# Patient Record
Sex: Female | Born: 1975 | Race: Black or African American | Hispanic: No | Marital: Single | Smoking: Current some day smoker
Health system: Southern US, Community
[De-identification: ages and names within clinical notes are randomized; demographics above are authoritative.]

## PROBLEM LIST (undated history)

## (undated) DIAGNOSIS — Z8679 Personal history of other diseases of the circulatory system: Secondary | ICD-10-CM

## (undated) DIAGNOSIS — I10 Essential (primary) hypertension: Secondary | ICD-10-CM

## (undated) DIAGNOSIS — N939 Abnormal uterine and vaginal bleeding, unspecified: Secondary | ICD-10-CM

## (undated) HISTORY — PX: KNEE SURGERY: SHX244

## (undated) HISTORY — PX: BREAST ENHANCEMENT SURGERY: SHX7

---

## 1999-06-13 ENCOUNTER — Emergency Department (HOSPITAL_COMMUNITY): Admission: EM | Admit: 1999-06-13 | Discharge: 1999-06-13 | Payer: Self-pay | Admitting: Emergency Medicine

## 2002-02-08 ENCOUNTER — Encounter: Admission: RE | Admit: 2002-02-08 | Discharge: 2002-02-08 | Payer: Self-pay | Admitting: Obstetrics and Gynecology

## 2002-10-13 ENCOUNTER — Emergency Department (HOSPITAL_COMMUNITY): Admission: EM | Admit: 2002-10-13 | Discharge: 2002-10-13 | Payer: Self-pay | Admitting: Emergency Medicine

## 2003-02-19 ENCOUNTER — Inpatient Hospital Stay (HOSPITAL_COMMUNITY): Admission: AD | Admit: 2003-02-19 | Discharge: 2003-02-19 | Payer: Self-pay | Admitting: *Deleted

## 2003-08-31 HISTORY — PX: OTHER SURGICAL HISTORY: SHX169

## 2005-04-07 ENCOUNTER — Inpatient Hospital Stay (HOSPITAL_COMMUNITY): Admission: AD | Admit: 2005-04-07 | Discharge: 2005-04-07 | Payer: Self-pay | Admitting: Obstetrics and Gynecology

## 2005-12-13 ENCOUNTER — Emergency Department (HOSPITAL_COMMUNITY): Admission: EM | Admit: 2005-12-13 | Discharge: 2005-12-13 | Payer: Self-pay | Admitting: Emergency Medicine

## 2008-01-27 ENCOUNTER — Emergency Department (HOSPITAL_COMMUNITY): Admission: EM | Admit: 2008-01-27 | Discharge: 2008-01-27 | Payer: Self-pay | Admitting: Emergency Medicine

## 2008-07-24 ENCOUNTER — Emergency Department (HOSPITAL_COMMUNITY): Admission: EM | Admit: 2008-07-24 | Discharge: 2008-07-24 | Payer: Self-pay | Admitting: Family Medicine

## 2010-01-27 ENCOUNTER — Emergency Department (HOSPITAL_COMMUNITY): Admission: EM | Admit: 2010-01-27 | Discharge: 2010-01-27 | Payer: Self-pay | Admitting: Family Medicine

## 2010-07-04 ENCOUNTER — Emergency Department (HOSPITAL_COMMUNITY)
Admission: EM | Admit: 2010-07-04 | Discharge: 2010-07-04 | Payer: Self-pay | Source: Home / Self Care | Admitting: Emergency Medicine

## 2010-12-17 ENCOUNTER — Emergency Department (HOSPITAL_COMMUNITY)
Admission: EM | Admit: 2010-12-17 | Discharge: 2010-12-17 | Disposition: A | Discharge status: Home / Self Care | Payer: Self-pay | Attending: Emergency Medicine | Admitting: Emergency Medicine

## 2010-12-17 ENCOUNTER — Emergency Department (HOSPITAL_COMMUNITY): Payer: Self-pay

## 2010-12-17 ENCOUNTER — Inpatient Hospital Stay (INDEPENDENT_AMBULATORY_CARE_PROVIDER_SITE_OTHER)
Admission: RE | Admit: 2010-12-17 | Discharge: 2010-12-17 | Disposition: A | Discharge status: Home / Self Care | Payer: Self-pay | Source: Ambulatory Visit | Attending: Family Medicine | Admitting: Family Medicine

## 2010-12-17 DIAGNOSIS — M6281 Muscle weakness (generalized): Secondary | ICD-10-CM

## 2010-12-17 DIAGNOSIS — D72829 Elevated white blood cell count, unspecified: Secondary | ICD-10-CM | POA: Insufficient documentation

## 2010-12-17 DIAGNOSIS — R509 Fever, unspecified: Secondary | ICD-10-CM | POA: Insufficient documentation

## 2010-12-17 DIAGNOSIS — N390 Urinary tract infection, site not specified: Secondary | ICD-10-CM | POA: Insufficient documentation

## 2010-12-17 DIAGNOSIS — D7289 Other specified disorders of white blood cells: Secondary | ICD-10-CM

## 2010-12-17 LAB — DIFFERENTIAL
Basophils Absolute: 0 10*3/uL (ref 0.0–0.1)
Eosinophils Absolute: 0 10*3/uL (ref 0.0–0.7)
Lymphocytes Relative: 6 % — ABNORMAL LOW (ref 12–46)
Lymphs Abs: 1.6 10*3/uL (ref 0.7–4.0)
Neutro Abs: 25 10*3/uL — ABNORMAL HIGH (ref 1.7–7.7)

## 2010-12-17 LAB — CBC
MCH: 27.7 pg (ref 26.0–34.0)
MCHC: 33.4 g/dL (ref 30.0–36.0)
Platelets: 283 10*3/uL (ref 150–400)

## 2010-12-17 LAB — POCT URINALYSIS DIP (DEVICE)
Bilirubin Urine: NEGATIVE
Nitrite: NEGATIVE
Protein, ur: NEGATIVE mg/dL
pH: 6.5 (ref 5.0–8.0)

## 2010-12-17 LAB — URINALYSIS, ROUTINE W REFLEX MICROSCOPIC
Ketones, ur: NEGATIVE mg/dL
Nitrite: NEGATIVE
Protein, ur: NEGATIVE mg/dL
Urobilinogen, UA: 0.2 mg/dL (ref 0.0–1.0)

## 2010-12-17 LAB — POCT I-STAT, CHEM 8
Creatinine, Ser: 0.9 mg/dL (ref 0.4–1.2)
HCT: 38 % (ref 36.0–46.0)
Hemoglobin: 12.9 g/dL (ref 12.0–15.0)
Sodium: 136 mEq/L (ref 135–145)
TCO2: 24 mmol/L (ref 0–100)

## 2010-12-17 LAB — POCT PREGNANCY, URINE: Preg Test, Ur: NEGATIVE

## 2010-12-18 LAB — URINE CULTURE
Colony Count: NO GROWTH
Culture  Setup Time: 201204191600
Culture: NO GROWTH

## 2010-12-20 ENCOUNTER — Emergency Department (HOSPITAL_COMMUNITY)
Admission: EM | Admit: 2010-12-20 | Discharge: 2010-12-21 | Disposition: A | Discharge status: Home / Self Care | Payer: Self-pay | Attending: Emergency Medicine | Admitting: Emergency Medicine

## 2010-12-20 ENCOUNTER — Inpatient Hospital Stay (INDEPENDENT_AMBULATORY_CARE_PROVIDER_SITE_OTHER)
Admission: RE | Admit: 2010-12-20 | Discharge: 2010-12-20 | Disposition: A | Discharge status: Home / Self Care | Payer: Self-pay | Source: Ambulatory Visit | Attending: Emergency Medicine | Admitting: Emergency Medicine

## 2010-12-20 DIAGNOSIS — D649 Anemia, unspecified: Secondary | ICD-10-CM | POA: Insufficient documentation

## 2010-12-20 DIAGNOSIS — R51 Headache: Secondary | ICD-10-CM | POA: Insufficient documentation

## 2010-12-20 DIAGNOSIS — R6883 Chills (without fever): Secondary | ICD-10-CM | POA: Insufficient documentation

## 2010-12-20 DIAGNOSIS — R5381 Other malaise: Secondary | ICD-10-CM | POA: Insufficient documentation

## 2010-12-20 DIAGNOSIS — R5383 Other fatigue: Secondary | ICD-10-CM | POA: Insufficient documentation

## 2010-12-20 DIAGNOSIS — R0789 Other chest pain: Secondary | ICD-10-CM | POA: Insufficient documentation

## 2010-12-20 DIAGNOSIS — E860 Dehydration: Secondary | ICD-10-CM | POA: Insufficient documentation

## 2010-12-20 LAB — URINE CULTURE

## 2010-12-21 LAB — URINALYSIS, ROUTINE W REFLEX MICROSCOPIC
Bilirubin Urine: NEGATIVE
Glucose, UA: NEGATIVE mg/dL
Ketones, ur: 15 mg/dL — AB
Leukocytes, UA: NEGATIVE
Nitrite: NEGATIVE
Protein, ur: 30 mg/dL — AB
Specific Gravity, Urine: 1.034 — ABNORMAL HIGH (ref 1.005–1.030)
Urobilinogen, UA: 0.2 mg/dL (ref 0.0–1.0)
pH: 6 (ref 5.0–8.0)

## 2010-12-21 LAB — DIFFERENTIAL
Basophils Absolute: 0 K/uL (ref 0.0–0.1)
Basophils Relative: 1 % (ref 0–1)
Eosinophils Absolute: 0.6 10*3/uL (ref 0.0–0.7)
Eosinophils Relative: 10 % — ABNORMAL HIGH (ref 0–5)
Lymphocytes Relative: 35 % (ref 12–46)
Lymphs Abs: 2.1 10*3/uL (ref 0.7–4.0)
Monocytes Absolute: 0.3 K/uL (ref 0.1–1.0)
Monocytes Relative: 6 % (ref 3–12)
Neutro Abs: 2.9 K/uL (ref 1.7–7.7)
Neutrophils Relative %: 49 % (ref 43–77)

## 2010-12-21 LAB — POCT PREGNANCY, URINE: Preg Test, Ur: NEGATIVE

## 2010-12-21 LAB — URINE MICROSCOPIC-ADD ON

## 2010-12-21 LAB — POCT CARDIAC MARKERS
CKMB, poc: 1.3 ng/mL (ref 1.0–8.0)
Myoglobin, poc: 69.6 ng/mL (ref 12–200)
Troponin i, poc: <0.05 ng/mL (ref 0.00–0.09)

## 2010-12-21 LAB — CBC
HCT: 29.6 % — ABNORMAL LOW (ref 36.0–46.0)
Hemoglobin: 9.8 g/dL — ABNORMAL LOW (ref 12.0–15.0)
MCH: 26.8 pg (ref 26.0–34.0)
MCHC: 33.1 g/dL (ref 30.0–36.0)
MCV: 81.1 fL (ref 78.0–100.0)
Platelets: 344 10*3/uL (ref 150–400)
RBC: 3.65 MIL/uL — ABNORMAL LOW (ref 3.87–5.11)
RDW: 15 % (ref 11.5–15.5)
WBC: 5.9 10*3/uL (ref 4.0–10.5)

## 2010-12-21 LAB — MONONUCLEOSIS SCREEN: Mono Screen: NEGATIVE
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
## 2014-01-07 ENCOUNTER — Other Ambulatory Visit (HOSPITAL_COMMUNITY): Payer: Self-pay | Admitting: Obstetrics

## 2014-01-07 DIAGNOSIS — Z1231 Encounter for screening mammogram for malignant neoplasm of breast: Secondary | ICD-10-CM

## 2014-01-31 ENCOUNTER — Ambulatory Visit (HOSPITAL_COMMUNITY)
Admission: RE | Admit: 2014-01-31 | Discharge: 2014-01-31 | Disposition: A | Discharge status: Home / Self Care | Payer: Medicaid Other | Source: Ambulatory Visit | Attending: Obstetrics | Admitting: Obstetrics

## 2014-01-31 DIAGNOSIS — Z1231 Encounter for screening mammogram for malignant neoplasm of breast: Secondary | ICD-10-CM | POA: Insufficient documentation

## 2014-02-01 ENCOUNTER — Other Ambulatory Visit (INDEPENDENT_AMBULATORY_CARE_PROVIDER_SITE_OTHER): Payer: Self-pay

## 2014-02-01 ENCOUNTER — Other Ambulatory Visit: Payer: Self-pay | Admitting: Obstetrics

## 2014-02-01 DIAGNOSIS — R928 Other abnormal and inconclusive findings on diagnostic imaging of breast: Secondary | ICD-10-CM

## 2014-02-13 ENCOUNTER — Ambulatory Visit
Admission: RE | Admit: 2014-02-13 | Discharge: 2014-02-13 | Disposition: A | Discharge status: Home / Self Care | Payer: Medicaid Other | Source: Ambulatory Visit | Attending: Obstetrics | Admitting: Obstetrics

## 2014-02-13 ENCOUNTER — Encounter (INDEPENDENT_AMBULATORY_CARE_PROVIDER_SITE_OTHER): Payer: Self-pay

## 2014-02-13 DIAGNOSIS — R928 Other abnormal and inconclusive findings on diagnostic imaging of breast: Secondary | ICD-10-CM

## 2014-12-02 ENCOUNTER — Emergency Department (HOSPITAL_BASED_OUTPATIENT_CLINIC_OR_DEPARTMENT_OTHER)
Admission: EM | Admit: 2014-12-02 | Discharge: 2014-12-03 | Disposition: A | Discharge status: Home / Self Care | Payer: Medicaid Other | Attending: Emergency Medicine | Admitting: Emergency Medicine

## 2014-12-02 ENCOUNTER — Encounter (HOSPITAL_BASED_OUTPATIENT_CLINIC_OR_DEPARTMENT_OTHER): Payer: Self-pay

## 2014-12-02 DIAGNOSIS — Z3202 Encounter for pregnancy test, result negative: Secondary | ICD-10-CM | POA: Diagnosis not present

## 2014-12-02 DIAGNOSIS — Z720 Tobacco use: Secondary | ICD-10-CM | POA: Diagnosis not present

## 2014-12-02 DIAGNOSIS — R42 Dizziness and giddiness: Secondary | ICD-10-CM | POA: Diagnosis present

## 2014-12-02 DIAGNOSIS — N3001 Acute cystitis with hematuria: Secondary | ICD-10-CM | POA: Diagnosis not present

## 2014-12-02 LAB — URINALYSIS, ROUTINE W REFLEX MICROSCOPIC
Bilirubin Urine: NEGATIVE
GLUCOSE, UA: NEGATIVE mg/dL
Ketones, ur: NEGATIVE mg/dL
Nitrite: POSITIVE — AB
PH: 6 (ref 5.0–8.0)
Protein, ur: NEGATIVE mg/dL
Specific Gravity, Urine: 1.015 (ref 1.005–1.030)
Urobilinogen, UA: 0.2 mg/dL (ref 0.0–1.0)

## 2014-12-02 LAB — URINE MICROSCOPIC-ADD ON

## 2014-12-02 LAB — PREGNANCY, URINE: Preg Test, Ur: NEGATIVE

## 2014-12-02 MED ORDER — SODIUM CHLORIDE 0.9 % IV BOLUS (SEPSIS)
1000.0000 mL | Freq: Once | INTRAVENOUS | Status: AC
  Administered 2014-12-02: 1000 mL via INTRAVENOUS

## 2014-12-02 NOTE — ED Notes (Signed)
C/o light headed, CP, fatigue x 3 weeks

## 2014-12-03 LAB — COMPREHENSIVE METABOLIC PANEL
ALBUMIN: 4.1 g/dL (ref 3.5–5.2)
ALT: 21 U/L (ref 0–35)
ANION GAP: 8 (ref 5–15)
AST: 25 U/L (ref 0–37)
Alkaline Phosphatase: 40 U/L (ref 39–117)
BUN: 6 mg/dL (ref 6–23)
CHLORIDE: 102 mmol/L (ref 96–112)
CO2: 27 mmol/L (ref 19–32)
CREATININE: 0.58 mg/dL (ref 0.50–1.10)
Calcium: 8.7 mg/dL (ref 8.4–10.5)
GFR calc Af Amer: >90 mL/min (ref >90)
GFR calc non Af Amer: >90 mL/min (ref >90)
Glucose, Bld: 95 mg/dL (ref 70–99)
POTASSIUM: 3.3 mmol/L — AB (ref 3.5–5.1)
Sodium: 137 mmol/L (ref 135–145)
TOTAL PROTEIN: 7.6 g/dL (ref 6.0–8.3)
Total Bilirubin: 0.2 mg/dL — ABNORMAL LOW (ref 0.3–1.2)

## 2014-12-03 MED ORDER — HYDROCODONE-ACETAMINOPHEN 5-325 MG PO TABS: 1.0000 | ORAL_TABLET | Freq: Four times a day (QID) | ORAL | Status: DC | PRN

## 2014-12-03 MED ORDER — NITROFURANTOIN MONOHYD MACRO 100 MG PO CAPS: 100.0000 mg | ORAL_CAPSULE | Freq: Two times a day (BID) | ORAL | Status: DC

## 2014-12-03 NOTE — Discharge Instructions (Signed)
Return here as needed.  Follow-up with the clinic provided.  °

## 2014-12-03 NOTE — ED Provider Notes (Signed)
CSN: [id number]     Arrival date & time 12/02/14  1840 History   First MD Initiated Contact with Patient 12/02/14 2241     Chief Complaint  Patient presents with  . Dizziness     (Consider location/radiation/quality/duration/timing/severity/associated sxs/prior Treatment) HPI Patient presents to the emergency department with lightheadedness, fatigue, and chest pain for the last 3 weeks.  The patient states that her symptoms have been persistent.  Nothing seems make her condition better or worse.  Patient denies shortness of breath, nausea, vomiting, weakness, dizziness, dizziness, headache, blurred vision, abdominal pain, rash, fever, cough, runny nose, sore throat, or syncope.  Patient states that she has felt somewhat dehydrated as well History reviewed. No pertinent past medical history. Past Surgical History  Procedure Laterality Date  . Knee surgery     No family history on file. History  Substance Use Topics  . Smoking status: Current Some Day Smoker  . Smokeless tobacco: Not on file  . Alcohol Use: No   OB History    No data available     Review of Systems  All other systems negative except as documented in the HPI. All pertinent positives and negatives as reviewed in the HPI.  Allergies  Review of patient's allergies indicates no known allergies.  Home Medications   Prior to Admission medications   Not on File   BP 134/68 mmHg  Pulse 83  Temp(Src) 98.8 F (37.1 C) (Oral)  Resp 16  Ht 5\' 5"  (1.651 m)  Wt 179 lb (81.194 kg)  BMI 29.79 kg/m2  SpO2 100%  LMP 11/23/2014 Physical Exam  Constitutional: She is oriented to person, place, and time. She appears well-developed and well-nourished. No distress.  HENT:  Head: Normocephalic and atraumatic.  Mouth/Throat: Oropharynx is clear and moist.  Eyes: Pupils are equal, round, and reactive to light.  Neck: Normal range of motion. Neck supple.  Cardiovascular: Normal rate, regular rhythm and normal heart sounds.   Exam reveals no gallop and no friction rub.   No murmur heard. Pulmonary/Chest: Effort normal and breath sounds normal. No respiratory distress. She has no wheezes.  Neurological: She is alert and oriented to person, place, and time. She exhibits normal muscle tone. Coordination normal.  Skin: Skin is warm and dry. No rash noted. No erythema.  Nursing note and vitals reviewed.   ED Course  Procedures (including critical care time) Labs Review Labs Reviewed  URINALYSIS, ROUTINE W REFLEX MICROSCOPIC - Abnormal; Notable for the following:    Hgb urine dipstick MODERATE (*)    Nitrite POSITIVE (*)    Leukocytes, UA SMALL (*)    All other components within normal limits  URINE MICROSCOPIC-ADD ON - Abnormal; Notable for the following:    Squamous Epithelial / LPF FEW (*)    Bacteria, UA MANY (*)    All other components within normal limits  COMPREHENSIVE METABOLIC PANEL - Abnormal; Notable for the following:    Potassium 3.3 (*)    Total Bilirubin 0.2 (*)    All other components within normal limits  PREGNANCY, URINE      EKG Interpretation   Date/Time:  Monday December 02 2014 19:04:55 EDT Ventricular Rate:  80 PR Interval:  150 QRS Duration: 78 QT Interval:  372 QTC Calculation: 429 R Axis:   62 Text Interpretation:  Normal sinus rhythm Septal infarct , age  undetermined Abnormal ECG since last tracing no significant change  Confirmed by BELFI  MD, MELANIE ([id number]) on 12/02/2014 7:47:40 PM  Patient is referred to her primary care Dr. told to return here as needed.  Will treated for her urinary tract infection as this could cause some of her symptoms    Dalia Heading, PA-C 12/05/14 0120  Pamella Pert, MD 12/06/14 1501

## 2015-12-24 LAB — LAB REPORT - SCANNED: PAP SMEAR: NEGATIVE

## 2016-04-24 ENCOUNTER — Encounter: Payer: Self-pay | Admitting: *Deleted

## 2019-06-26 ENCOUNTER — Other Ambulatory Visit: Payer: Self-pay | Admitting: Obstetrics and Gynecology

## 2020-02-13 ENCOUNTER — Other Ambulatory Visit: Payer: Self-pay | Admitting: Obstetrics and Gynecology

## 2020-02-18 ENCOUNTER — Other Ambulatory Visit: Payer: Self-pay | Admitting: Obstetrics and Gynecology

## 2020-02-19 NOTE — Progress Notes (Signed)
Spoke with patient by phone, patient to call dr cousins office and reschedule surgery

## 2020-02-27 ENCOUNTER — Other Ambulatory Visit: Payer: Self-pay

## 2020-02-27 ENCOUNTER — Encounter (HOSPITAL_BASED_OUTPATIENT_CLINIC_OR_DEPARTMENT_OTHER): Payer: Self-pay | Admitting: Obstetrics and Gynecology

## 2020-02-27 NOTE — Progress Notes (Signed)
Spoke w/ via phone for pre-op interview---patient Lab needs dos----       Urine poct, cbc, I stat 8, ekg ( patient reports being off hypertension medications for last 3 months due to 10 pound weight loss and states blood pressure is normal now)        COVID test ------03-01-2020 at 1225 Arrive at -------830 am 03-05-2020 No food after midnight, clear liquids from midnight until 730 am then npo Medications to take morning of surgery -----none Diabetic medication -----n/a Patient Special Instructions -----none Pre-Op special Istructions -----none Patient verbalized understanding of instructions that were given at this phone interview. Patient denies shortness of breath, chest pain, fever, cough a this phone interview.

## 2020-03-01 ENCOUNTER — Other Ambulatory Visit (HOSPITAL_COMMUNITY)
Admission: RE | Admit: 2020-03-01 | Discharge: 2020-03-01 | Disposition: A | Discharge status: Home / Self Care | Payer: 59 | Source: Ambulatory Visit | Attending: Obstetrics and Gynecology | Admitting: Obstetrics and Gynecology

## 2020-03-01 DIAGNOSIS — Z20822 Contact with and (suspected) exposure to covid-19: Secondary | ICD-10-CM | POA: Insufficient documentation

## 2020-03-01 DIAGNOSIS — Z01812 Encounter for preprocedural laboratory examination: Secondary | ICD-10-CM | POA: Insufficient documentation

## 2020-03-01 LAB — SARS CORONAVIRUS 2 (TAT 6-24 HRS): SARS Coronavirus 2: NEGATIVE

## 2020-03-05 ENCOUNTER — Other Ambulatory Visit: Payer: Self-pay

## 2020-03-05 ENCOUNTER — Ambulatory Visit (HOSPITAL_BASED_OUTPATIENT_CLINIC_OR_DEPARTMENT_OTHER): Payer: 59 | Admitting: Anesthesiology

## 2020-03-05 ENCOUNTER — Encounter (HOSPITAL_BASED_OUTPATIENT_CLINIC_OR_DEPARTMENT_OTHER)
Admission: RE | Disposition: A | Discharge status: Home / Self Care | Payer: Self-pay | Source: Home / Self Care | Attending: Obstetrics and Gynecology

## 2020-03-05 ENCOUNTER — Ambulatory Visit (HOSPITAL_BASED_OUTPATIENT_CLINIC_OR_DEPARTMENT_OTHER)
Admission: RE | Admit: 2020-03-05 | Discharge: 2020-03-05 | Disposition: A | Discharge status: Home / Self Care | Payer: 59 | Attending: Obstetrics and Gynecology | Admitting: Obstetrics and Gynecology

## 2020-03-05 ENCOUNTER — Encounter (HOSPITAL_BASED_OUTPATIENT_CLINIC_OR_DEPARTMENT_OTHER): Payer: Self-pay | Admitting: Obstetrics and Gynecology

## 2020-03-05 DIAGNOSIS — Z6831 Body mass index (BMI) 31.0-31.9, adult: Secondary | ICD-10-CM | POA: Diagnosis not present

## 2020-03-05 DIAGNOSIS — I10 Essential (primary) hypertension: Secondary | ICD-10-CM | POA: Insufficient documentation

## 2020-03-05 DIAGNOSIS — E669 Obesity, unspecified: Secondary | ICD-10-CM | POA: Diagnosis not present

## 2020-03-05 DIAGNOSIS — D250 Submucous leiomyoma of uterus: Secondary | ICD-10-CM | POA: Diagnosis not present

## 2020-03-05 DIAGNOSIS — N920 Excessive and frequent menstruation with regular cycle: Secondary | ICD-10-CM | POA: Insufficient documentation

## 2020-03-05 DIAGNOSIS — F17210 Nicotine dependence, cigarettes, uncomplicated: Secondary | ICD-10-CM | POA: Diagnosis not present

## 2020-03-05 HISTORY — DX: Essential (primary) hypertension: I10

## 2020-03-05 HISTORY — DX: Abnormal uterine and vaginal bleeding, unspecified: N93.9

## 2020-03-05 HISTORY — DX: Personal history of other diseases of the circulatory system: Z86.79

## 2020-03-05 HISTORY — PX: DILITATION & CURRETTAGE/HYSTROSCOPY WITH NOVASURE ABLATION: SHX5568

## 2020-03-05 LAB — POCT I-STAT, CHEM 8
BUN: 7 mg/dL (ref 6–20)
Calcium, Ion: 1.2 mmol/L (ref 1.15–1.40)
Chloride: 106 mmol/L (ref 98–111)
Creatinine, Ser: 0.6 mg/dL (ref 0.44–1.00)
Glucose, Bld: 94 mg/dL (ref 70–99)
HCT: 34 % — ABNORMAL LOW (ref 36.0–46.0)
Hemoglobin: 11.6 g/dL — ABNORMAL LOW (ref 12.0–15.0)
Potassium: 3.5 mmol/L (ref 3.5–5.1)
Sodium: 142 mmol/L (ref 135–145)
TCO2: 23 mmol/L (ref 22–32)

## 2020-03-05 LAB — CBC
HCT: 32.6 % — ABNORMAL LOW (ref 36.0–46.0)
Hemoglobin: 10 g/dL — ABNORMAL LOW (ref 12.0–15.0)
MCH: 24.6 pg — ABNORMAL LOW (ref 26.0–34.0)
MCHC: 30.7 g/dL (ref 30.0–36.0)
MCV: 80.3 fL (ref 80.0–100.0)
Platelets: 318 10*3/uL (ref 150–400)
RBC: 4.06 MIL/uL (ref 3.87–5.11)
RDW: 16.8 % — ABNORMAL HIGH (ref 11.5–15.5)
WBC: 6.7 10*3/uL (ref 4.0–10.5)
nRBC: 0 % (ref 0.0–0.2)

## 2020-03-05 LAB — POCT PREGNANCY, URINE: Preg Test, Ur: NEGATIVE

## 2020-03-05 SURGERY — DILATATION & CURETTAGE/HYSTEROSCOPY WITH NOVASURE ABLATION
Anesthesia: General

## 2020-03-05 MED ORDER — SODIUM CHLORIDE 0.9 % IR SOLN
Status: DC | PRN
  Administered 2020-03-05 (×2): 3000 mL

## 2020-03-05 MED ORDER — MIDAZOLAM HCL 5 MG/5ML IJ SOLN
INTRAMUSCULAR | Status: DC | PRN
  Administered 2020-03-05: 2 mg via INTRAVENOUS

## 2020-03-05 MED ORDER — FENTANYL CITRATE (PF) 100 MCG/2ML IJ SOLN
INTRAMUSCULAR | Status: DC | PRN
  Administered 2020-03-05 (×2): 25 ug via INTRAVENOUS
  Administered 2020-03-05: 50 ug via INTRAVENOUS

## 2020-03-05 MED ORDER — POVIDONE-IODINE 10 % EX SWAB
2.0000 "application " | Freq: Once | CUTANEOUS | Status: AC
  Administered 2020-03-05: 2 via TOPICAL

## 2020-03-05 MED ORDER — HYDROCODONE-ACETAMINOPHEN 5-325 MG PO TABS: 1.0000 | ORAL_TABLET | Freq: Four times a day (QID) | ORAL | 0 refills | Status: AC | PRN

## 2020-03-05 MED ORDER — IBUPROFEN 800 MG PO TABS: 800.0000 mg | ORAL_TABLET | Freq: Three times a day (TID) | ORAL | 5 refills | Status: AC | PRN

## 2020-03-05 MED ORDER — FENTANYL CITRATE (PF) 100 MCG/2ML IJ SOLN
INTRAMUSCULAR | Status: AC
  Filled 2020-03-05: qty 2

## 2020-03-05 MED ORDER — PROPOFOL 10 MG/ML IV BOLUS
INTRAVENOUS | Status: AC
  Filled 2020-03-05: qty 20

## 2020-03-05 MED ORDER — OXYCODONE HCL 5 MG/5ML PO SOLN: 5.0000 mg | Freq: Once | ORAL | Status: AC | PRN

## 2020-03-05 MED ORDER — LACTATED RINGERS IV SOLN: INTRAVENOUS | Status: DC

## 2020-03-05 MED ORDER — PROMETHAZINE HCL 25 MG/ML IJ SOLN: 6.2500 mg | INTRAMUSCULAR | Status: DC | PRN

## 2020-03-05 MED ORDER — LIDOCAINE 2% (20 MG/ML) 5 ML SYRINGE
INTRAMUSCULAR | Status: AC
  Filled 2020-03-05: qty 5

## 2020-03-05 MED ORDER — OXYCODONE HCL 5 MG PO TABS
5.0000 mg | ORAL_TABLET | Freq: Once | ORAL | Status: AC | PRN
  Administered 2020-03-05: 5 mg via ORAL

## 2020-03-05 MED ORDER — LIDOCAINE 2% (20 MG/ML) 5 ML SYRINGE
INTRAMUSCULAR | Status: DC | PRN
  Administered 2020-03-05: 60 mg via INTRAVENOUS

## 2020-03-05 MED ORDER — PROPOFOL 10 MG/ML IV BOLUS
INTRAVENOUS | Status: DC | PRN
  Administered 2020-03-05: 200 mg via INTRAVENOUS

## 2020-03-05 MED ORDER — DEXAMETHASONE SODIUM PHOSPHATE 10 MG/ML IJ SOLN
INTRAMUSCULAR | Status: AC
  Filled 2020-03-05: qty 1

## 2020-03-05 MED ORDER — OXYCODONE HCL 5 MG PO TABS
ORAL_TABLET | ORAL | Status: AC
  Filled 2020-03-05: qty 1

## 2020-03-05 MED ORDER — KETOROLAC TROMETHAMINE 30 MG/ML IJ SOLN
INTRAMUSCULAR | Status: DC | PRN
Start: 2020-03-05 — End: 2020-03-05
  Administered 2020-03-05: 30 mg via INTRAVENOUS

## 2020-03-05 MED ORDER — ONDANSETRON HCL 4 MG/2ML IJ SOLN
INTRAMUSCULAR | Status: AC
  Filled 2020-03-05: qty 2

## 2020-03-05 MED ORDER — DEXAMETHASONE SODIUM PHOSPHATE 4 MG/ML IJ SOLN
INTRAMUSCULAR | Status: DC | PRN
  Administered 2020-03-05: 10 mg via INTRAVENOUS

## 2020-03-05 MED ORDER — KETOROLAC TROMETHAMINE 30 MG/ML IJ SOLN
INTRAMUSCULAR | Status: AC
  Filled 2020-03-05: qty 1

## 2020-03-05 MED ORDER — ONDANSETRON HCL 4 MG/2ML IJ SOLN
INTRAMUSCULAR | Status: DC | PRN
  Administered 2020-03-05: 4 mg via INTRAVENOUS

## 2020-03-05 MED ORDER — MIDAZOLAM HCL 2 MG/2ML IJ SOLN
INTRAMUSCULAR | Status: AC
  Filled 2020-03-05: qty 2

## 2020-03-05 MED ORDER — FENTANYL CITRATE (PF) 100 MCG/2ML IJ SOLN
25.0000 ug | INTRAMUSCULAR | Status: DC | PRN
  Administered 2020-03-05 (×2): 50 ug via INTRAVENOUS

## 2020-03-05 SURGICAL SUPPLY — 25 items
ABLATOR ENDOMETRIAL MYOSURE (ABLATOR) IMPLANT
ABLATOR SURESOUND NOVASURE (ABLATOR) ×2 IMPLANT
BIPOLAR CUTTING LOOP 21FR (ELECTRODE)
CANISTER SUCT 3000ML PPV (MISCELLANEOUS) ×3 IMPLANT
CATH ROBINSON RED A/P 16FR (CATHETERS) IMPLANT
COVER WAND RF STERILE (DRAPES) ×3 IMPLANT
DILATOR CANAL MILEX (MISCELLANEOUS) IMPLANT
ELECT COAG BIPOL BALL 21FR (ELECTRODE) ×2 IMPLANT
GAUZE 4X4 16PLY RFD (DISPOSABLE) ×3 IMPLANT
GAUZE PETROLATUM 1 X8 (GAUZE/BANDAGES/DRESSINGS) IMPLANT
GLOVE BIOGEL PI IND STRL 7.0 (GLOVE) ×1 IMPLANT
GLOVE BIOGEL PI IND STRL 7.5 (GLOVE) IMPLANT
GLOVE BIOGEL PI INDICATOR 7.0 (GLOVE) ×2
GLOVE BIOGEL PI INDICATOR 7.5 (GLOVE) ×2
GLOVE ECLIPSE 6.5 STRL STRAW (GLOVE) ×3 IMPLANT
GOWN STRL REUS W/TWL LRG LVL3 (GOWN DISPOSABLE) ×3 IMPLANT
IV NS IRRIG 3000ML ARTHROMATIC (IV SOLUTION) ×3 IMPLANT
KIT PROCEDURE FLUENT (KITS) ×3 IMPLANT
KIT TURNOVER CYSTO (KITS) ×3 IMPLANT
LOOP CUTTING BIPOLAR 21FR (ELECTRODE) IMPLANT
PACK VAGINAL MINOR WOMEN LF (CUSTOM PROCEDURE TRAY) ×3 IMPLANT
PAD OB MATERNITY 4.3X12.25 (PERSONAL CARE ITEMS) ×3 IMPLANT
PAD PREP 24X48 CUFFED NSTRL (MISCELLANEOUS) ×3 IMPLANT
TOWEL OR 17X26 10 PK STRL BLUE (TOWEL DISPOSABLE) IMPLANT
WATER STERILE IRR 500ML POUR (IV SOLUTION) ×3 IMPLANT

## 2020-03-05 NOTE — Transfer of Care (Signed)
Immediate Anesthesia Transfer of Care Note  Patient: Jessica Sutton  Procedure(s) Performed: Procedure(s) (LRB): DILATATION & CURETTAGE/HYSTEROSCOPY WITH NOVASURE ABLATION (N/A)  Patient Location: PACU  Anesthesia Type: General  Level of Consciousness: awake, sedated, patient cooperative and responds to stimulation  Airway & Oxygen Therapy: Patient Spontanous Breathing and Patient connected to [place] 02 and soft FM  Post-op Assessment: Report given to PACU RN, Post -op Vital signs reviewed and stable and Patient moving all extremities  Post vital signs: Reviewed and stable  Complications: No apparent anesthesia complications

## 2020-03-05 NOTE — Anesthesia Postprocedure Evaluation (Signed)
Anesthesia Post Note  Patient: Jessica Sutton  Procedure(s) Performed: DILATATION & CURETTAGE/HYSTEROSCOPY WITH NOVASURE ABLATION (N/A )     Patient location during evaluation: PACU Anesthesia Type: General Level of consciousness: awake and alert Pain management: pain level controlled Vital Signs Assessment: post-procedure vital signs reviewed and stable Respiratory status: spontaneous breathing, nonlabored ventilation and respiratory function stable Cardiovascular status: blood pressure returned to baseline and stable Postop Assessment: no apparent nausea or vomiting Anesthetic complications: no   No complications documented.  Last Vitals:  Vitals:   03/05/20 1145 03/05/20 1200  BP: 139/82 (!) 142/80  Pulse: 72 72  Resp: 17 20  Temp:    SpO2: 100% 99%    Last Pain:  Vitals:   03/05/20 1200  TempSrc:   PainSc: [place]

## 2020-03-05 NOTE — Anesthesia Procedure Notes (Signed)
Procedure Name: LMA Insertion Date/Time: 03/05/2020 10:38 AM Performed by: Justice Rocher, CRNA Pre-anesthesia Checklist: Patient identified, Emergency Drugs available, Suction available, Patient being monitored and Timeout performed Patient Re-evaluated:Patient Re-evaluated prior to induction Oxygen Delivery Method: Circle system utilized Preoxygenation: Pre-oxygenation with 100% oxygen Induction Type: IV induction Ventilation: Mask ventilation without difficulty LMA: LMA inserted LMA Size: 4.0 Number of attempts: 1 Airway Equipment and Method: Bite block Placement Confirmation: positive ETCO2,  CO2 detector and breath sounds checked- equal and bilateral Tube secured with: Tape Dental Injury: Teeth and Oropharynx as per pre-operative assessment

## 2020-03-05 NOTE — Discharge Instructions (Signed)
CALL  IF TEMP>100.4, NOTHING PER VAGINA X 2 WK, CALL IF SOAKING A MAXI  PAD EVERY HOUR OR MORE FREQUENTLY DISCHARGE INSTRUCTIONS: HYSTEROSCOPY / ENDOMETRIAL ABLATION The following instructions have been prepared to help you care for yourself upon your return home.  May Remove Scop patch on or before  May take Ibuprofen after 5pm today   May take stool softner while taking narcotic pain medication to prevent constipation.  Drink plenty of water.  Personal hygiene: Marland Kitchen Use sanitary pads for vaginal drainage, not tampons. . Shower the day after your procedure. . NO tub baths, pools or Jacuzzis for 2-3 weeks. . Wipe front to back after using the bathroom.  Activity and limitations: . Do NOT drive or operate any equipment for 24 hours. The effects of anesthesia are still present and drowsiness may result. . Do NOT rest in bed all day. . Walking is encouraged. . Walk up and down stairs slowly. . You may resume your normal activity in one to two days or as indicated by your physician. Sexual activity: NO intercourse for at least 2 weeks after the procedure, or as indicated by your Doctor.  Diet: Eat a light meal as desired this evening. You may resume your usual diet tomorrow.  Return to Work: You may resume your work activities in one to two days or as indicated by Marine scientist.  What to expect after your surgery: Expect to have vaginal bleeding/discharge for 2-3 days and spotting for up to 10 days. It is not unusual to have soreness for up to 1-2 weeks. You may have a slight burning sensation when you urinate for the first day. Mild cramps may continue for a couple of days. You may have a regular period in 2-6 weeks.  Call your doctor for any of the following: . Excessive vaginal bleeding or clotting, saturating and changing one pad every hour. . Inability to urinate 6 hours after discharge from hospital. . Pain not relieved by pain medication. . Fever of 100.4 F or greater. .  Unusual vaginal discharge or odor.  Return to office _________________Call for an appointment ___________________ Patient's signature: ______________________ Nurse's signature ________________________  Post Anesthesia Care Unit [phone]  Post Anesthesia Home Care Instructions  Activity: Get plenty of rest for the remainder of the day. A responsible adult should stay with you for 24 hours following the procedure.  For the next 24 hours, DO NOT: -Drive a car -Paediatric nurse -Drink alcoholic beverages -Take any medication unless instructed by your physician -Make any legal decisions or sign important papers.  Meals: Start with liquid foods such as gelatin or soup. Progress to regular foods as tolerated. Avoid greasy, spicy, heavy foods. If nausea and/or vomiting occur, drink only clear liquids until the nausea and/or vomiting subsides. Call your physician if vomiting continues.  Special Instructions/Symptoms: Your throat may feel dry or sore from the anesthesia or the breathing tube placed in your throat during surgery. If this causes discomfort, gargle with warm salt water. The discomfort should disappear within 24 hours.  If you had a scopolamine patch placed behind your ear for the management of post- operative nausea and/or vomiting:  1. The medication in the patch is effective for 72 hours, after which it should be removed.  Wrap patch in a tissue and discard in the trash. Wash hands thoroughly with soap and water. 2. You may remove the patch earlier than 72 hours if you experience unpleasant side effects which may include dry mouth, dizziness or visual  disturbances. 3. Avoid touching the patch. Wash your hands with soap and water after contact with the patch.

## 2020-03-05 NOTE — Anesthesia Preprocedure Evaluation (Addendum)
Anesthesia Evaluation  Patient identified by MRN, date of birth, ID band Patient awake    Reviewed: Allergy & Precautions, NPO status , Patient's Chart, lab work & pertinent test results  History of Anesthesia Complications Negative for: history of anesthetic complications  Airway Mallampati: II  TM Distance: >3 FB Neck ROM: Full    Dental  (+) Dental Advisory Given, Teeth Intact   Pulmonary Current Smoker and Patient abstained from smoking.,    Pulmonary exam normal        Cardiovascular hypertension (no longer on meds), Normal cardiovascular exam     Neuro/Psych negative neurological ROS  negative psych ROS   GI/Hepatic negative GI ROS, Neg liver ROS,   Endo/Other   Obesity   Renal/GU negative Renal ROS     Musculoskeletal negative musculoskeletal ROS (+)   Abdominal   Peds  Hematology negative hematology ROS (+)   Anesthesia Other Findings Covid test negative   Reproductive/Obstetrics                           Anesthesia Physical Anesthesia Plan  ASA: II  Anesthesia Plan: General   Post-op Pain Management:    Induction: Intravenous  PONV Risk Score and Plan: 3 and Treatment may vary due to age or medical condition, Ondansetron, Dexamethasone and Midazolam  Airway Management Planned: LMA  Additional Equipment: None  Intra-op Plan:   Post-operative Plan: Extubation in OR  Informed Consent: I have reviewed the patients History and Physical, chart, labs and discussed the procedure including the risks, benefits and alternatives for the proposed anesthesia with the patient or authorized representative who has indicated his/her understanding and acceptance.     Dental advisory given  Plan Discussed with: CRNA and Anesthesiologist  Anesthesia Plan Comments:        Anesthesia Quick Evaluation

## 2020-03-05 NOTE — Brief Op Note (Signed)
03/05/2020  11:29 AM  PATIENT:  Jessica Sutton  44 y.o. female  PRE-OPERATIVE DIAGNOSIS:  Menorrhagia with regular cycle  POST-OPERATIVE DIAGNOSIS:   Menorrhagia with regular cycle  PROCEDURE:  Diagnostic hysteroscopy, Novasure endometrial ablation, rollerball resection  SURGEON:  Surgeon(s) and Role:    * Servando Salina, MD - Primary  PHYSICIAN ASSISTANT:   ASSISTANTS: none   ANESTHESIA:   general Findings: small ant submucosal fibroid, ? Arcuate uterus, both tubal ostia seen EBL:  20 mL   BLOOD ADMINISTERED:none  DRAINS: none   LOCAL MEDICATIONS USED:  NONE  SPECIMEN:  No Specimen  DISPOSITION OF SPECIMEN:  N/A  COUNTS:  YES  TOURNIQUET:  * No tourniquets in log *  DICTATION: .Other Dictation: Dictation Number [id number]  PLAN OF CARE: Discharge to home after PACU  PATIENT DISPOSITION:  PACU - hemodynamically stable.   Delay start of Pharmacological VTE agent (>24hrs) due to surgical blood loss or risk of bleeding: no

## 2020-03-05 NOTE — H&P (Signed)
Jessica Sutton is an 44 y.o. female.G6P4 BF hx TL presents for dx hysteroscopy, endometrial ablation  Pertinent Gynecological History: Menses: flow is excessive with use of 5 pads or tampons on heaviest days Bleeding: menorrhagia Contraception: tubal ligation DES exposure: denies Blood transfusions: none Sexually transmitted diseases: no past history Previous GYN Procedures: DNC  Last mammogram: normal Date: 12/2019 Last pap: normal Date: 01/01/2020 OB History: G6P4   Menstrual History: Menarche age: n/a Patient's last menstrual period was 02/20/2020.    Past Medical History:  Diagnosis Date  . Abnormal uterine bleeding (AUB)   . History of hypertension    offf meds last 2 to 3 months as of 02-27-2020 weight loss 10 pounds    Past Surgical History:  Procedure Laterality Date  . KNEE SURGERY Right age 65  . tummy tuck  2005    History reviewed. No pertinent family history.  Social History:  reports that she has been smoking cigarettes. She has never used smokeless tobacco.  Drug: Marijuana. She reports that she does not drink alcohol.  Allergies: No Known Allergies  No medications prior to admission.    Review of Systems  All other systems reviewed and are negative.   Height 5\' 6"  (1.676 m), weight 82.6 kg, last menstrual period 02/20/2020. Physical Exam Constitutional:      Appearance: Normal appearance.  Eyes:     Pupils: Pupils are equal, round, and reactive to light.  Cardiovascular:     Rate and Rhythm: Regular rhythm.     Heart sounds: Normal heart sounds.  Pulmonary:     Effort: Pulmonary effort is normal.  Abdominal:     Palpations: Abdomen is soft.  Genitourinary:    General: Normal vulva.     Comments: Vagina nl Cervix closed  Uterus sl enlarged  Adnexa nl Musculoskeletal:     Cervical back: Neck supple.  Skin:    General: Skin is warm and dry.  Neurological:     Mental Status: She is alert and oriented to person, place, and time.   Psychiatric:        Mood and Affect: Mood normal.     No results found for this or any previous visit (from the past 24 hour(s)).  No results found.  Assessment/Plan: Menorrhagia with regular cycle P) dx hysteroscopy, endometrial ablation. Risk of surgery reviewed including infection, bleeding, injury to surrounding organ structures, thermal injury, uterine perforation and its risk, 90% reduction in flow. All ? naswered  Cypress Hinkson A Laquetta Racey 03/05/2020, 2:13 AM

## 2020-03-05 NOTE — Interval H&P Note (Signed)
History and Physical Interval Note:  03/05/2020 10:25 AM  Jessica Sutton  has presented today for surgery, with the diagnosis of Abnormal uterine bleeding, menorrhagia.  The various methods of treatment have been discussed with the patient and family. After consideration of risks, benefits and other options for treatment, the patient has consented to  Procedure(s): [place] (N/A) as a surgical intervention.  The patient's history has been reviewed, patient examined, no change in status, stable for surgery.  I have reviewed the patient's chart and labs.  Questions were answered to the patient's satisfaction.     Danyetta Gillham A Daimian Sudberry

## 2020-03-06 ENCOUNTER — Encounter (HOSPITAL_BASED_OUTPATIENT_CLINIC_OR_DEPARTMENT_OTHER): Payer: Self-pay | Admitting: Obstetrics and Gynecology

## 2020-03-07 NOTE — Op Note (Signed)
NAME: Jessica Sutton, Jessica Sutton MEDICAL RECORD [id number] ACCOUNT [id number] DATE OF BIRTH:[birth year] FACILITY: [facility] LOCATION: [facility] PHYSICIAN:Evyn Kooyman A. Saleen Peden, MD  OPERATIVE REPORT  DATE OF PROCEDURE:  03/05/2020  PREOPERATIVE DIAGNOSIS:  Menorrhagia with regular cycle.  PROCEDURE PERFORMED:  Diagnostic hysteroscopy, NovaSure endometrial ablation, rollerball ablation.  POSTOPERATIVE DIAGNOSIS:  Menorrhagia with regular cycles.  ANESTHESIA:  General.  SURGEON:  Servando Salina, MD  ASSISTANT:  None.  DESCRIPTION OF PROCEDURE:  Under adequate general anesthesia, the patient was placed in the dorsal lithotomy position.  She was sterilely prepped and draped in usual fashion.  The patient had voided prior to entering the room.  Therefore, she was not  catheterized.  Examination under anesthesia revealed a bulky anteflexed uterus.  No adnexal masses could be appreciated.  Bivalve speculum was placed in the vagina.  Single tooth tenaculum was placed on the anterior lip of the cervix.  The uterus sounded  to 9 cm.  The endocervical canal sounded to 3.5 cm.  The diagnostic hysteroscope was introduced into the uterine cavity.  A wide endometrial cavity with a slight( subtle) anterior  submucosal fibroid was noted.  Questionable arcuate appearance of the fundus of  the uterus.  The hysteroscope was removed.  No other lesions were noted.  The NovaSure endometrial apparatus was then placed.  After about 3 attempts, the array deployed to a cavity width of 4.6 cm.  Using 139 watts and a time of 1 minute and 12  seconds, the endometrial cavity was ablated.  The ablative apparatus was removed.  The diagnostic hysteroscope was inserted.  At that point, it was noted that basically the right more than 1/2 of the cavity had been ablated, but on the opposite side,  there was still an area that was still not ablated.  Suggested that one of the arms of the wand did not deploy all the way laterally.   Therefore, decision was then made to change to a resectoscope roller ball and that was then utilized to rollerball resect the remaining portion of the unablated area.  When this was felt to be adequate, the rollerball hysteroscope was removed.  The diagnostic hysteroscope was then inserted and good ablation was noted.  At that point, all instruments were then removed  from the vagina.  SPECIMEN:  None.  ESTIMATED BLOOD LOSS:  20 mL.  COMPLICATIONS:  None.  DISPOSITION:  The patient tolerated the procedure well and was transferred to recovery room in stable condition.  CN/NUANCE  D:03/06/2020 T:03/07/2020 JOB:[id number]

## 2024-01-22 ENCOUNTER — Encounter (HOSPITAL_BASED_OUTPATIENT_CLINIC_OR_DEPARTMENT_OTHER): Payer: Self-pay | Admitting: Emergency Medicine

## 2024-01-22 ENCOUNTER — Emergency Department (HOSPITAL_BASED_OUTPATIENT_CLINIC_OR_DEPARTMENT_OTHER): Payer: Self-pay

## 2024-01-22 ENCOUNTER — Emergency Department (HOSPITAL_BASED_OUTPATIENT_CLINIC_OR_DEPARTMENT_OTHER)
Admission: EM | Admit: 2024-01-22 | Discharge: 2024-01-22 | Disposition: A | Discharge status: Home / Self Care | Payer: Self-pay | Attending: Emergency Medicine | Admitting: Emergency Medicine

## 2024-01-22 ENCOUNTER — Other Ambulatory Visit: Payer: Self-pay

## 2024-01-22 DIAGNOSIS — N83202 Unspecified ovarian cyst, left side: Secondary | ICD-10-CM | POA: Diagnosis not present

## 2024-01-22 DIAGNOSIS — R1032 Left lower quadrant pain: Secondary | ICD-10-CM | POA: Diagnosis present

## 2024-01-22 LAB — COMPREHENSIVE METABOLIC PANEL WITH GFR
ALT: 12 U/L (ref 0–44)
AST: 26 U/L (ref 15–41)
Albumin: 4.1 g/dL (ref 3.5–5.0)
Alkaline Phosphatase: 54 U/L (ref 38–126)
Anion gap: 12 (ref 5–15)
BUN: 8 mg/dL (ref 6–20)
CO2: 23 mmol/L (ref 22–32)
Calcium: 9.1 mg/dL (ref 8.9–10.3)
Chloride: 103 mmol/L (ref 98–111)
Creatinine, Ser: 0.71 mg/dL (ref 0.44–1.00)
GFR, Estimated: >60 mL/min (ref >60)
Glucose, Bld: 87 mg/dL (ref 70–99)
Potassium: 3.6 mmol/L (ref 3.5–5.1)
Sodium: 138 mmol/L (ref 135–145)
Total Bilirubin: 0.6 mg/dL (ref 0.0–1.2)
Total Protein: 7.1 g/dL (ref 6.5–8.1)

## 2024-01-22 LAB — URINALYSIS, ROUTINE W REFLEX MICROSCOPIC
Bilirubin Urine: NEGATIVE
Glucose, UA: NEGATIVE mg/dL
Ketones, ur: NEGATIVE mg/dL
Leukocytes,Ua: NEGATIVE
Nitrite: NEGATIVE
Protein, ur: NEGATIVE mg/dL
Specific Gravity, Urine: 1.015 (ref 1.005–1.030)
pH: 7.5 (ref 5.0–8.0)

## 2024-01-22 LAB — CBC WITH DIFFERENTIAL/PLATELET
Abs Immature Granulocytes: 0.04 10*3/uL (ref 0.00–0.07)
Basophils Absolute: 0.1 10*3/uL (ref 0.0–0.1)
Basophils Relative: 1 %
Eosinophils Absolute: 0.2 10*3/uL (ref 0.0–0.5)
Eosinophils Relative: 2 %
HCT: 38.8 % (ref 36.0–46.0)
Hemoglobin: 12.9 g/dL (ref 12.0–15.0)
Immature Granulocytes: 1 %
Lymphocytes Relative: 31 %
Lymphs Abs: 2.7 10*3/uL (ref 0.7–4.0)
MCH: 29.3 pg (ref 26.0–34.0)
MCHC: 33.2 g/dL (ref 30.0–36.0)
MCV: 88.2 fL (ref 80.0–100.0)
Monocytes Absolute: 0.6 10*3/uL (ref 0.1–1.0)
Monocytes Relative: 6 %
Neutro Abs: 5.2 10*3/uL (ref 1.7–7.7)
Neutrophils Relative %: 59 %
Platelets: 261 10*3/uL (ref 150–400)
RBC: 4.4 MIL/uL (ref 3.87–5.11)
RDW: 14 % (ref 11.5–15.5)
WBC: 8.7 10*3/uL (ref 4.0–10.5)
nRBC: 0 % (ref 0.0–0.2)

## 2024-01-22 LAB — URINALYSIS, MICROSCOPIC (REFLEX)

## 2024-01-22 LAB — PREGNANCY, URINE: Preg Test, Ur: NEGATIVE

## 2024-01-22 MED ORDER — KETOROLAC TROMETHAMINE 15 MG/ML IJ SOLN
15.0000 mg | Freq: Once | INTRAMUSCULAR | Status: AC
Start: 2024-01-22 — End: 2024-01-22
  Administered 2024-01-22: 15 mg via INTRAVENOUS
  Filled 2024-01-22: qty 1

## 2024-01-22 MED ORDER — ONDANSETRON HCL 4 MG/2ML IJ SOLN
4.0000 mg | Freq: Once | INTRAMUSCULAR | Status: AC
  Administered 2024-01-22: 4 mg via INTRAVENOUS
  Filled 2024-01-22: qty 2

## 2024-01-22 MED ORDER — MORPHINE SULFATE (PF) 4 MG/ML IV SOLN
4.0000 mg | Freq: Once | INTRAVENOUS | Status: AC
  Administered 2024-01-22: 4 mg via INTRAVENOUS
  Filled 2024-01-22: qty 1

## 2024-01-22 MED ORDER — MORPHINE SULFATE 15 MG PO TABS: 7.5000 mg | ORAL_TABLET | ORAL | 0 refills | Status: AC | PRN

## 2024-01-22 MED ORDER — SODIUM CHLORIDE 0.9 % IV BOLUS
1000.0000 mL | Freq: Once | INTRAVENOUS | Status: AC
  Administered 2024-01-22: 1000 mL via INTRAVENOUS

## 2024-01-22 MED ORDER — ONDANSETRON 4 MG PO TBDP: ORAL_TABLET | ORAL | 0 refills | Status: AC

## 2024-01-22 NOTE — ED Notes (Signed)
 Pt requested water for PO challenge

## 2024-01-22 NOTE — ED Triage Notes (Signed)
 Pt c/o LT lower back pain that radiates around to groin since yesterday

## 2024-01-22 NOTE — ED Provider Notes (Signed)
 [facility] Provider Note   CSN: [id number] Arrival date & time: 01/22/24  1904     History  Chief Complaint  Patient presents with   Back Pain    Jessica Sutton is a 48 y.o. female.  48 yo F with a chief complaints of left groin pain.  This has been going on since yesterday.  Seems to be worse with some movement and positions.  She denies injury to the area.  Denies urinary symptoms denies fevers.  She denies nausea or vomiting.  She does feel like it goes into her back at times.   Back Pain      Home Medications Prior to Admission medications   Medication Sig Start Date End Date Taking? Authorizing Provider  morphine (MSIR) 15 MG tablet Take 0.5 tablets (7.5 mg total) by mouth every 4 (four) hours as needed. 01/22/24  Yes Albertus Hughs, DO  ondansetron  (ZOFRAN -ODT) 4 MG disintegrating tablet 4mg  ODT q4 hours prn nausea/vomit 01/22/24  Yes Alaney Witter, DO  Eszopiclone (LUNESTA PO) Take by mouth at bedtime.    [provider]  ibuprofen  (ADVIL ) 800 MG tablet Take 1 tablet (800 mg total) by mouth every 8 (eight) hours as needed. 03/05/20   Ivery Marking, MD      Allergies    Patient has no known allergies.    Review of Systems   Review of Systems  Musculoskeletal:  Positive for back pain.    Physical Exam Updated Vital Signs BP (!) 172/77 (BP Location: Right Wrist)   Pulse 71   Temp 99.1 F (37.3 C) (Oral)   Resp 18   Ht 5\' 6"  (1.676 m)   Wt 90.7 kg   LMP 12/29/2023 (Approximate)   SpO2 98%   BMI 32.28 kg/m  Physical Exam Vitals and nursing note reviewed.  Constitutional:      General: She is not in acute distress.    Appearance: She is well-developed. She is not diaphoretic.  HENT:     Head: Normocephalic and atraumatic.  Eyes:     Pupils: Pupils are equal, round, and reactive to light.  Cardiovascular:     Rate and Rhythm: Normal rate and regular rhythm.     Heart sounds: No murmur heard.    No  friction rub. No gallop.  Pulmonary:     Effort: Pulmonary effort is normal.     Breath sounds: No wheezing or rales.  Abdominal:     General: There is no distension.     Palpations: Abdomen is soft.     Tenderness: There is no abdominal tenderness.  Musculoskeletal:        General: No tenderness.     Cervical back: Normal range of motion and neck supple.     Comments: Patient has some tenderness about the attachment of the adductor muscles to the pubic symphysis on the left.  She has no obvious pain with internal rotation of the hip.  Has some pain about the left adnexa without obvious mass.  Some pain extends into the left lower quadrant.  No obvious midline spinal tenderness step-offs or deformities.  Pulse motor and sensation intact to the left lower extremity.  Negative straight leg raise test.  Skin:    General: Skin is warm and dry.  Neurological:     Mental Status: She is alert and oriented to person, place, and time.  Psychiatric:        Behavior: Behavior normal.  ED Results / Procedures / Treatments   Labs (all labs ordered are listed, but only abnormal results are displayed) Labs Reviewed  URINALYSIS, ROUTINE W REFLEX MICROSCOPIC - Abnormal; Notable for the following components:      Result Value   Hgb urine dipstick MODERATE (*)    All other components within normal limits  URINALYSIS, MICROSCOPIC (REFLEX) - Abnormal; Notable for the following components:   Bacteria, UA RARE (*)    All other components within normal limits  PREGNANCY, URINE  CBC WITH DIFFERENTIAL/PLATELET  COMPREHENSIVE METABOLIC PANEL WITH GFR    EKG None  Radiology US  PELVIC COMPLETE W TRANSVAGINAL AND TORSION R/O Result Date: 01/22/2024 CLINICAL DATA:  Left adnexal tenderness on left lower quadrant pain. LMP 12/29/2023. EXAM: TRANSABDOMINAL AND TRANSVAGINAL ULTRASOUND OF PELVIS DOPPLER ULTRASOUND OF OVARIES TECHNIQUE: Both transabdominal and transvaginal ultrasound examinations of the  pelvis were performed. Transabdominal technique was performed for global imaging of the pelvis including uterus, ovaries, adnexal regions, and pelvic cul-de-sac. It was necessary to proceed with endovaginal exam following the transabdominal exam to visualize the uterus and ovaries. Color and duplex Doppler ultrasound was utilized to evaluate blood flow to the ovaries. COMPARISON:  None Available. FINDINGS: Uterus Measurements: 10.5 x 5.3 x 6.3 cm = volume: 185 mL. Heterogenous echotexture. Hypoechoic area in the right fundus may represent a uterine fibroid measuring 1.1 x 1.1 x 0.8 cm Endometrium Thickness: 10 mm.  No focal abnormality visualized. Right ovary Surgically absent. Left ovary Measurements: 7.7 x 5.0 x 5.4 cm = volume: 88 mL. Bilobed complex mass with internal septations and debris measuring 7.4 x 3.3 x 3.7 cm. Peripheral blood flow is present ovary. Pulsed Doppler evaluation of the left ovary demonstrates normal low-resistance arterial and venous waveforms in the periphery of the left ovary. Other findings No abnormal free fluid. IMPRESSION: 1. Complex left ovarian mass measuring 7.4 cm. Differential considerations include hemorrhagic cyst or cystic neoplasm. Recommend follow-up US  in 3-6 months. Note: This recommendation does not apply to premenarchal patients or to those with increased risk (genetic, family history, elevated tumor markers or other high-risk factors) of ovarian cancer. Reference: Radiology 2019 Nov; 293(2):359-371. 2. Peripheral blood flow is present in the left ovary. 3. Heterogenous echotexture of the uterus could represent adenomyosis. Probable 1.1 cm fibroid. Electronically Signed   By: Rozell Cornet M.D.   On: 01/22/2024 20:49    Procedures Procedures    Medications Ordered in ED Medications  sodium chloride  0.9 % bolus 1,000 mL (0 mLs Intravenous Stopped 01/22/24 2137)  morphine (PF) 4 MG/ML injection 4 mg (4 mg Intravenous Given 01/22/24 2044)  ondansetron  (ZOFRAN )  injection 4 mg (4 mg Intravenous Given 01/22/24 2044)  ketorolac  (TORADOL ) 15 MG/ML injection 15 mg (15 mg Intravenous Given 01/22/24 2156)    ED Course/ Medical Decision Making/ A&P                                 Medical Decision Making Amount and/or Complexity of Data Reviewed Labs: ordered. Radiology: ordered.  Risk Prescription drug management.   48 yo F with a chief complaints of left groin pain.  This has been going on since yesterday.  It does have a positional component to it but I am not really able to recreate it with movement of her low back or with her leg.  She points mostly to the left adnexal region.  Will obtain an ultrasound to assess for torsion.  Treat pain and nausea.  Blood work.  Reassess.  No leukocytosis no anemia, no significant electrolyte abnormalities.  Ultrasound shows a fairly large left-sided ovarian mass versus hemorrhagic cyst.  No signs of torsion.  I reassessed the patient and she is feeling a bit better.  Discussed results with her.  Encouraged her to follow-up with OB/GYN in the office.  10:37 PM:  I have discussed the diagnosis/risks/treatment options with the patient.  Evaluation and diagnostic testing in the emergency department does not suggest an emergent condition requiring admission or immediate intervention beyond what has been performed at this time.  They will follow up with PCP, GYN. We also discussed returning to the ED immediately if new or worsening sx occur. We discussed the sx which are most concerning (e.g., sudden worsening pain, fever, inability to tolerate by mouth) that necessitate immediate return. Medications administered to the patient during their visit and any new prescriptions provided to the patient are listed below.  Medications given during this visit Medications  sodium chloride  0.9 % bolus 1,000 mL (0 mLs Intravenous Stopped 01/22/24 2137)  morphine (PF) 4 MG/ML injection 4 mg (4 mg Intravenous Given 01/22/24 2044)   ondansetron  (ZOFRAN ) injection 4 mg (4 mg Intravenous Given 01/22/24 2044)  ketorolac  (TORADOL ) 15 MG/ML injection 15 mg (15 mg Intravenous Given 01/22/24 2156)     The patient appears reasonably screen and/or stabilized for discharge and I doubt any other medical condition or other [facility] requiring further screening, evaluation, or treatment in the ED at this time prior to discharge.          Final Clinical Impression(s) / ED Diagnoses Final diagnoses:  Cyst of left ovary    Rx / DC Orders ED Discharge Orders          Ordered    morphine (MSIR) 15 MG tablet  Every 4 hours PRN        01/22/24 2136    ondansetron  (ZOFRAN -ODT) 4 MG disintegrating tablet        01/22/24 2136              Albertus Hughs, DO 01/22/24 2238

## 2024-01-22 NOTE — Discharge Instructions (Addendum)
 Your ultrasound shows an enlargement to the left ovarian area.  The radiologist read this as a possible mass or a cyst.  I have started you on pain medication.  Please call the OB/GYN office on Tuesday to set up an appointment.  If you do not have 1 I provided you with our women's clinic information.  The radiologist recommended that you get this reimaged in about 3 to 6 months.  Take 4 over the counter ibuprofen  tablets 3 times a day or 2 over-the-counter naproxen tablets twice a day for pain. Also take tylenol  1000mg (2 extra strength) four times a day.   Then take the pain medicine if you feel like you need it. Narcotics do not help with the pain, they only make you care about it less.  You can become addicted to this, people may break into your house to steal it.  It will constipate you.  If you drive under the influence of this medicine you can get a DUI.

## 2024-01-22 NOTE — ED Notes (Signed)
 Pt not in room.
